# Patient Record
Sex: Male | Born: 1980 | Race: White | Hispanic: No | Marital: Married | Smoking: Never smoker
Health system: Southern US, Community
[De-identification: ages and names within clinical notes are randomized; demographics above are authoritative.]

---

## 2012-04-05 ENCOUNTER — Ambulatory Visit (HOSPITAL_BASED_OUTPATIENT_CLINIC_OR_DEPARTMENT_OTHER)
Admission: RE | Admit: 2012-04-05 | Discharge: 2012-04-05 | Disposition: A | Discharge status: Home / Self Care | Payer: Self-pay | Source: Ambulatory Visit | Attending: Occupational Medicine | Admitting: Occupational Medicine

## 2012-04-05 ENCOUNTER — Other Ambulatory Visit: Payer: Self-pay | Admitting: Occupational Medicine

## 2012-04-05 DIAGNOSIS — Z0000 Encounter for general adult medical examination without abnormal findings: Secondary | ICD-10-CM | POA: Insufficient documentation

## 2021-07-30 ENCOUNTER — Emergency Department (HOSPITAL_BASED_OUTPATIENT_CLINIC_OR_DEPARTMENT_OTHER)
Admission: EM | Admit: 2021-07-30 | Discharge: 2021-07-30 | Disposition: A | Discharge status: Home / Self Care | Payer: Managed Care, Other (non HMO) | Attending: Emergency Medicine | Admitting: Emergency Medicine

## 2021-07-30 ENCOUNTER — Encounter (HOSPITAL_BASED_OUTPATIENT_CLINIC_OR_DEPARTMENT_OTHER): Payer: Self-pay

## 2021-07-30 ENCOUNTER — Emergency Department (HOSPITAL_BASED_OUTPATIENT_CLINIC_OR_DEPARTMENT_OTHER): Payer: Managed Care, Other (non HMO)

## 2021-07-30 ENCOUNTER — Other Ambulatory Visit: Payer: Self-pay

## 2021-07-30 DIAGNOSIS — Z20822 Contact with and (suspected) exposure to covid-19: Secondary | ICD-10-CM | POA: Diagnosis not present

## 2021-07-30 DIAGNOSIS — R0602 Shortness of breath: Secondary | ICD-10-CM | POA: Insufficient documentation

## 2021-07-30 DIAGNOSIS — J45909 Unspecified asthma, uncomplicated: Secondary | ICD-10-CM | POA: Diagnosis not present

## 2021-07-30 LAB — CBC WITH DIFFERENTIAL/PLATELET
Abs Immature Granulocytes: 0.01 10*3/uL (ref 0.00–0.07)
Basophils Absolute: 0 10*3/uL (ref 0.0–0.1)
Basophils Relative: 0 %
Eosinophils Absolute: 0.1 10*3/uL (ref 0.0–0.5)
Eosinophils Relative: 2 %
HCT: 42 % (ref 39.0–52.0)
Hemoglobin: 14.2 g/dL (ref 13.0–17.0)
Immature Granulocytes: 0 %
Lymphocytes Relative: 27 %
Lymphs Abs: 1.4 10*3/uL (ref 0.7–4.0)
MCH: 28.6 pg (ref 26.0–34.0)
MCHC: 33.8 g/dL (ref 30.0–36.0)
MCV: 84.5 fL (ref 80.0–100.0)
Monocytes Absolute: 0.6 10*3/uL (ref 0.1–1.0)
Monocytes Relative: 11 %
Neutro Abs: 3 10*3/uL (ref 1.7–7.7)
Neutrophils Relative %: 60 %
Platelets: 191 10*3/uL (ref 150–400)
RBC: 4.97 MIL/uL (ref 4.22–5.81)
RDW: 13.9 % (ref 11.5–15.5)
WBC: 5.1 10*3/uL (ref 4.0–10.5)
nRBC: 0 % (ref 0.0–0.2)

## 2021-07-30 LAB — COMPREHENSIVE METABOLIC PANEL
ALT: 25 U/L (ref 0–44)
AST: 24 U/L (ref 15–41)
Albumin: 4.5 g/dL (ref 3.5–5.0)
Alkaline Phosphatase: 79 U/L (ref 38–126)
Anion gap: 9 (ref 5–15)
BUN: 22 mg/dL — ABNORMAL HIGH (ref 6–20)
CO2: 26 mmol/L (ref 22–32)
Calcium: 9.4 mg/dL (ref 8.9–10.3)
Chloride: 101 mmol/L (ref 98–111)
Creatinine, Ser: 1.14 mg/dL (ref 0.61–1.24)
GFR, Estimated: >60 mL/min (ref >60)
Glucose, Bld: 108 mg/dL — ABNORMAL HIGH (ref 70–99)
Potassium: 3.9 mmol/L (ref 3.5–5.1)
Sodium: 136 mmol/L (ref 135–145)
Total Bilirubin: 0.8 mg/dL (ref 0.3–1.2)
Total Protein: 7.3 g/dL (ref 6.5–8.1)

## 2021-07-30 LAB — TROPONIN I (HIGH SENSITIVITY): Troponin I (High Sensitivity): 2 ng/L (ref <18)

## 2021-07-30 LAB — TSH: TSH: 2.064 u[IU]/mL (ref 0.350–4.500)

## 2021-07-30 LAB — RESP PANEL BY RT-PCR (FLU A&B, COVID) ARPGX2
Influenza A by PCR: NEGATIVE
Influenza B by PCR: NEGATIVE
SARS Coronavirus 2 by RT PCR: NEGATIVE

## 2021-07-30 LAB — D-DIMER, QUANTITATIVE: D-Dimer, Quant: <0.27 ug/mL-FEU (ref 0.00–0.50)

## 2021-07-30 NOTE — ED Provider Notes (Signed)
[facility] EMERGENCY DEPARTMENT Provider Note   CSN: [id number] Arrival date & time: 07/30/21  7416     History  Chief Complaint  Patient presents with   Shortness of Breath    Jeffrey Martinez is a 41 y.o. male.  Patient is a 41 year old male with no significant past medical history who is presenting today with complaint of shortness of breath.  Patient reports for this whole week he has had intermittent episodes of feeling constricted when he is breathing intermittently.  He feels a constriction and slight tightness in his upper chest right at his throat.  This is only been present at rest.  Sometimes at night when he is laying down he has it but it can be during the day while he is sitting there as well.  It never seems to occur with exertion.  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  He reports he has been more tired this week as well but has not had any significant congestion or cough different from his baseline.  He does not think he came in contact with anything that he could have inhaled or caused any issues.  He does not have a history of asthma he has not noticed any wheezing or stridor.  Sometimes it feels like it is hard to swallow but has not been consistent.  Before the symptoms started he noticed maybe some discomfort in his left side last week but he thought it was related to working out.  He has not had any abdominal pain, vomiting, weight loss, diarrhea.  No fevers.  He does not take any medications and has not noticed any swelling in his lower extremities.  The history is provided by the patient.  Shortness of Breath     Home Medications Prior to Admission medications   Not on File      Allergies    Patient has no known allergies.    Review of Systems   Review of Systems  Respiratory:  Positive for shortness of breath.    Physical Exam Updated Vital  Signs BP 137/90 (BP Location: Right Arm)    Pulse 78    Temp 98.4 F (36.9 C) (Oral)    Resp 18    Ht 5\' 7"  (1.702 m)    Wt 75.8 kg    SpO2 100%    BMI 26.16 kg/m  Physical Exam Vitals and nursing note reviewed.  Constitutional:      General: He is not in acute distress.    Appearance: He is well-developed.  HENT:     Head: Normocephalic and atraumatic.     Mouth/Throat:     Mouth: Mucous membranes are moist.  Eyes:     Conjunctiva/sclera: Conjunctivae normal.     Pupils: Pupils are equal, round, and reactive to light.  Cardiovascular:     Rate and Rhythm: Normal rate and regular rhythm.     Pulses: Normal pulses.     Heart sounds: No murmur heard. Pulmonary:     Effort: Pulmonary effort is normal. No respiratory distress.     Breath sounds: Normal breath sounds. No wheezing or rales.  Abdominal:     General: There is no distension.     Palpations: Abdomen is soft.     Tenderness: There is no abdominal tenderness. There is no guarding or rebound.  Musculoskeletal:  General: No tenderness. Normal range of motion.     Cervical back: Normal range of motion and neck supple.     Right lower leg: No edema.     Left lower leg: No edema.  Skin:    General: Skin is warm and dry.     Findings: No erythema or rash.  Neurological:     Mental Status: He is alert and oriented to person, place, and time. Mental status is at baseline.  Psychiatric:        Mood and Affect: Mood normal.        Behavior: Behavior normal.    ED Results / Procedures / Treatments   Labs (all labs ordered are listed, but only abnormal results are displayed) Labs Reviewed  COMPREHENSIVE METABOLIC PANEL - Abnormal; Notable for the following components:      Result Value   Glucose, Bld 108 (*)    BUN 22 (*)    All other components within normal limits  RESP PANEL BY RT-PCR (FLU A&B, COVID) ARPGX2  CBC WITH DIFFERENTIAL/PLATELET  D-DIMER, QUANTITATIVE  TSH  TROPONIN I (HIGH SENSITIVITY)  TROPONIN I  (HIGH SENSITIVITY)    EKG EKG Interpretation  Date/Time:  Friday July 30 2021 09:57:21 EST Ventricular Rate:  74 PR Interval:  242 QRS Duration: 86 QT Interval:  364 QTC Calculation: 404 R Axis:   71 Text Interpretation: Sinus rhythm Prolonged PR interval No previous tracing Confirmed by Gwyneth SproutPlunkett, Cason Dabney ([id number]) on 07/30/2021 10:24:52 AM  Radiology DG Chest 2 View  Result Date: 07/30/2021 CLINICAL DATA:  Shortness of breath EXAM: CHEST - 2 VIEW COMPARISON:  2013 FINDINGS: The heart size and mediastinal contours are within normal limits. Both lungs are clear. No pleural effusion or pneumothorax. The visualized skeletal structures are unremarkable. IMPRESSION: No acute process in the chest. Electronically Signed   By: Guadlupe SpanishPraneil  Patel M.D.   On: 07/30/2021 09:56    Procedures Procedures    Medications Ordered in ED Medications - No data to display  ED Course/ Medical Decision Making/ A&P                           Medical Decision Making Amount and/or Complexity of Data Reviewed Labs: ordered. Decision-making details documented in ED Course. Radiology: ordered and independent interpretation performed. Decision-making details documented in ED Course. ECG/medicine tests: ordered and independent interpretation performed. Decision-making details documented in ED Course.   Patient is a 41 year old male presenting today with dyspnea at rest.  This has been intermittent for the last week.  He does not have it with exertion and feels fine when he is exerting himself.  He has not had any infectious symptoms and has not had any significant chest pain or abdominal pain.  His vital signs are normal here with blood pressure of 122/81, normal heart rate and oxygen saturation.  There has been no recent medications, his physical exam is normal.  He is low risk and does report he had COVID in August but did not have any lasting effects from it.  Discussed plan with the patient and his wife will  evaluate ensure no evidence of anemia, leukemia, electrolyte disturbance, PE or acute cardiac issue.  We will check a chest x-ray to ensure no evidence of small pneumothorax.  Possibility could be mild COVID symptoms.  I independently interpreted patient's chest x-ray which is normal and his EKG which shows a shortened PR interval but no other acute findings.  There  are no old EKGs to compare.  I independently interpreted patient's labs which show a normal CBC, CMP, troponin, D-dimer.  TSH and COVID test are pending.  11:25 AM I discussed the patient's results with he and his wife and answered all questions.  They will follow-up on the TSH via MyChart.  They were given information for PCPs and a social work consult was placed to help them potentially find a PCP as he is called multiple people and they are not excepting new patients.  He is insured.  Patient appears stable for discharge.  Sats and blood pressure remain normal.  Patient was given return precautions.        Final Clinical Impression(s) / ED Diagnoses Final diagnoses:  Shortness of breath    Rx / DC Orders ED Discharge Orders     None         Gwyneth Sprout, MD 07/30/21 1127

## 2021-07-30 NOTE — ED Notes (Signed)
No acute distress noted upon this RN's departure of patient. Verified discharge paperwork with name and DOB. Vital signs stable. Patient taken to checkout window. Discharge paperwork discussed with patient. No further questions voiced upon discharge.  ° °

## 2021-07-30 NOTE — Discharge Instructions (Signed)
The thyroid test should come back later today and you can check that on the MyChart account.  All your other blood tests were very reassuring today.  Try taking Zyrtec or Claritin for 1 week to see if that improves your symptoms.  If you start having shortness of breath with activity, any swelling of your legs or pain in your chest or noisy breathing return to the emergency room.  However if this does not improve we will have to have more in-depth testing and possible pulmonary function test.  A few doctors were provided to try and called to see if they are accepting new patients but also somebody with social work should be calling you to help you if you have any issues.

## 2021-07-30 NOTE — ED Notes (Signed)
Ambulated from lobby to room7, SpO2 99-100%, HR 96-99, no DOE noted.

## 2021-07-30 NOTE — Progress Notes (Signed)
RNCM received TOC consult to assist with locating PCP. ED RNCM spoke with patient who states he was at the gym and unable to take the information down. RNCM called back to leave a voicemail with the PCP accepting new patients, however patient's voicemail was full.  Current PCP accepting new patients: [facility]: [place]: [phone]

## 2021-07-30 NOTE — ED Triage Notes (Signed)
Pt arrives with reports of feeling like he is having some upper airway issues states that exertionally he feels fine but sometimes feels like he can not get his breath at rest. Pt ambulated to room with pulse ox reading of 99-100% room air. Pt reports episodes happen everyday.

## 2022-06-16 IMAGING — DX DG CHEST 2V
2 series · 2 of 2 positions shown · non-contrast
Comparison: 6589

CLINICAL DATA: Shortness of breath

EXAM:
CHEST - 2 VIEW

[chest pa]
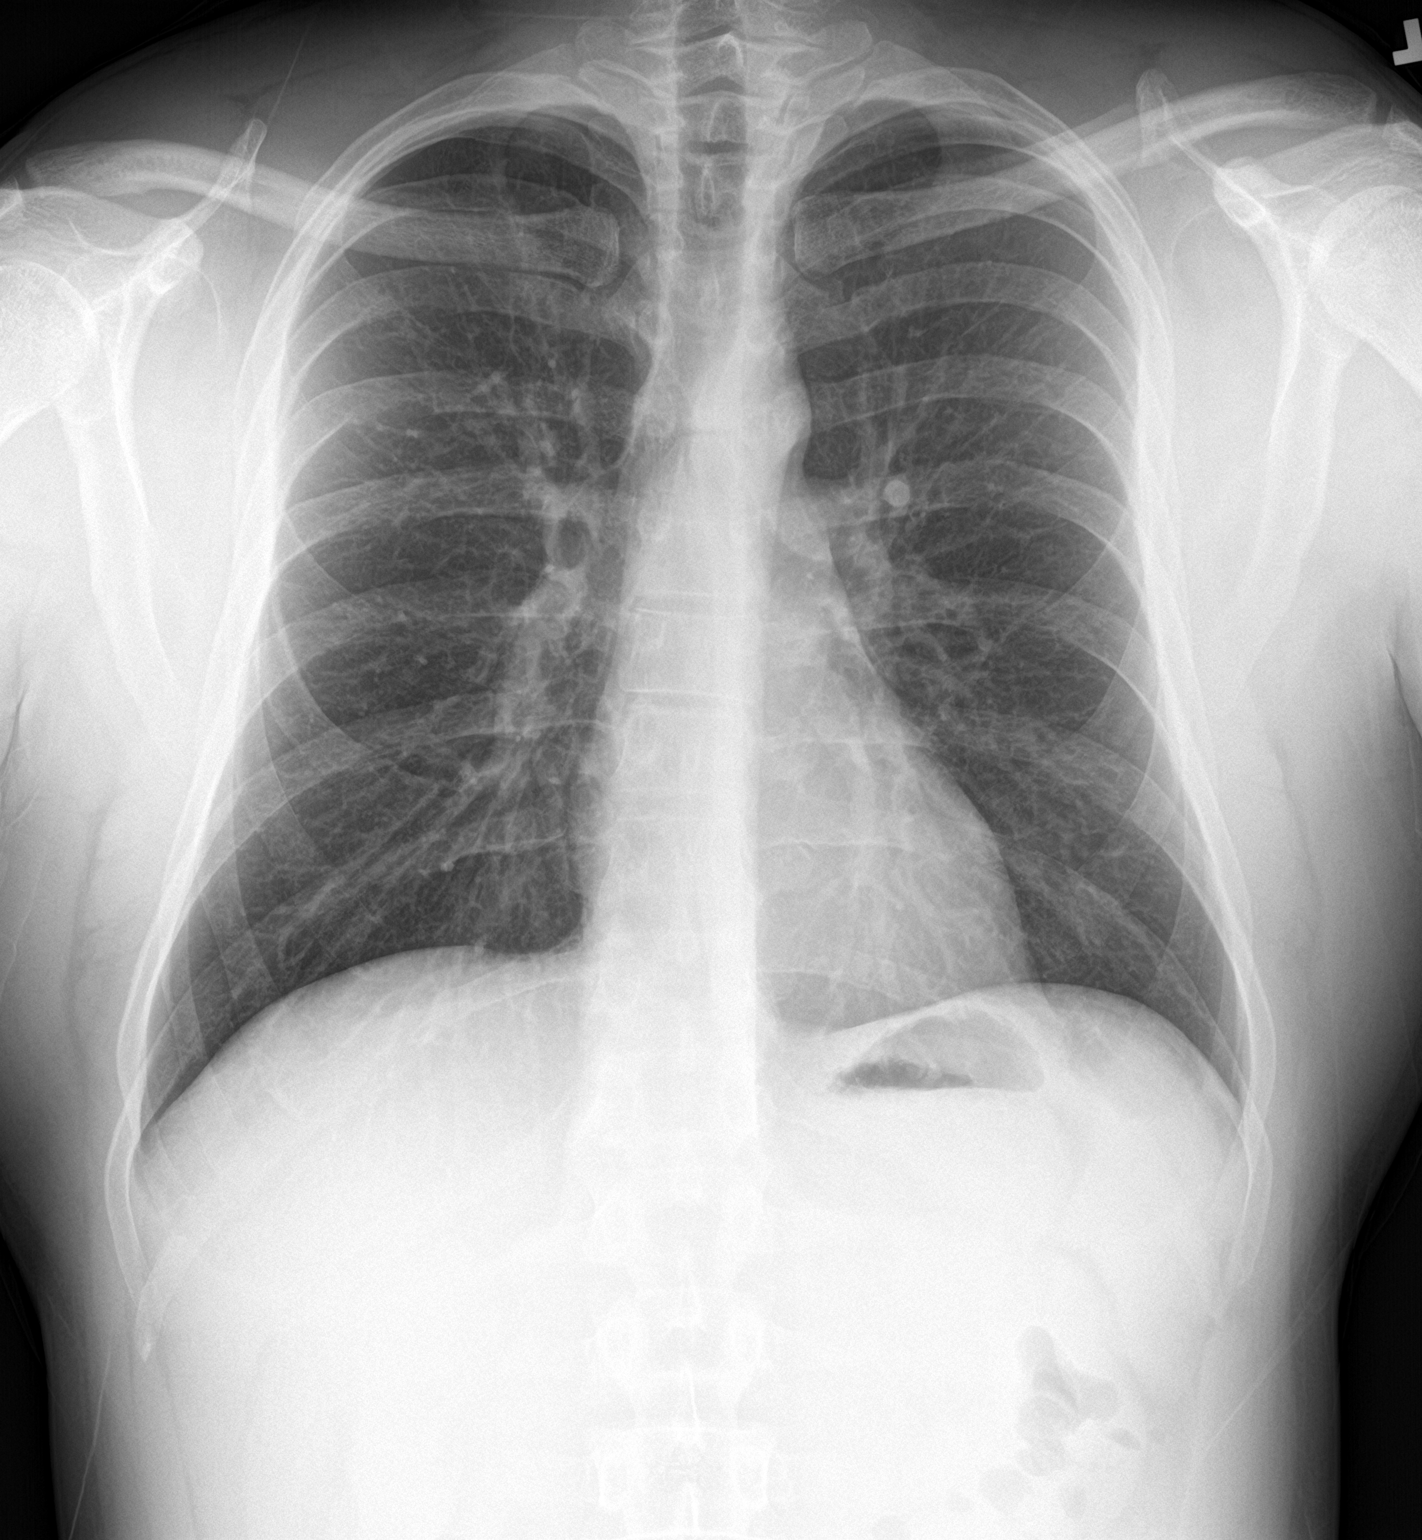

[chest lat]
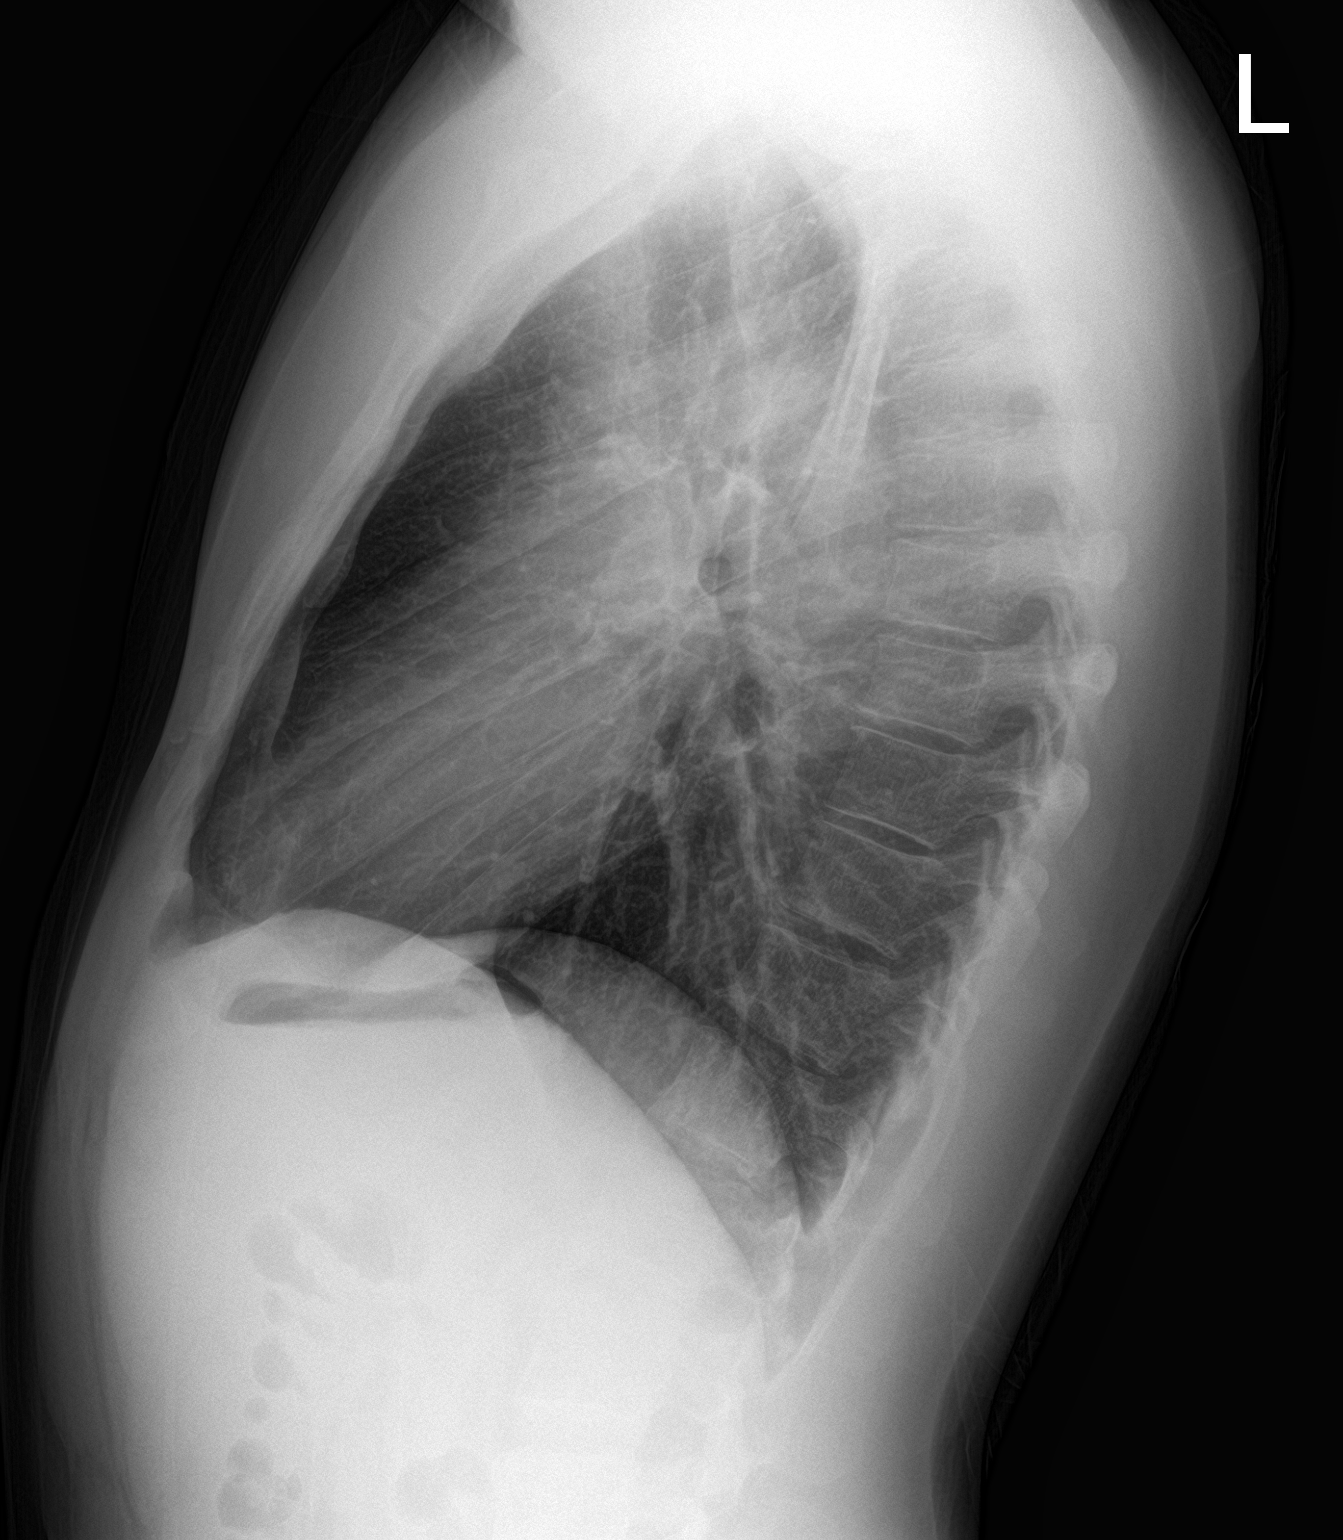

[2 of 2 positions shown; findings below may reference images not displayed]

FINDINGS: The heart size and mediastinal contours are within normal limits.
Both lungs are clear. No pleural effusion or pneumothorax. The
visualized skeletal structures are unremarkable.
IMPRESSION: No acute process in the chest.
